# Patient Record
Sex: Female | Born: 1960 | Race: White | Hispanic: No | Marital: Single
Health system: Southern US, Community
[De-identification: ages and names within clinical notes are randomized; demographics above are authoritative.]

---

## 2004-10-08 ENCOUNTER — Inpatient Hospital Stay: Payer: Self-pay | Admitting: Internal Medicine

## 2005-05-29 ENCOUNTER — Ambulatory Visit: Payer: Self-pay | Admitting: Internal Medicine

## 2005-10-02 ENCOUNTER — Ambulatory Visit: Payer: Self-pay | Admitting: Internal Medicine

## 2005-10-18 ENCOUNTER — Ambulatory Visit: Payer: Self-pay | Admitting: Family Medicine

## 2005-11-05 ENCOUNTER — Ambulatory Visit: Payer: Self-pay | Admitting: Family Medicine

## 2005-11-14 ENCOUNTER — Ambulatory Visit: Payer: Self-pay | Admitting: Internal Medicine

## 2005-12-06 ENCOUNTER — Ambulatory Visit: Payer: Self-pay

## 2005-12-10 ENCOUNTER — Ambulatory Visit: Payer: Self-pay | Admitting: Internal Medicine

## 2006-02-07 ENCOUNTER — Ambulatory Visit: Payer: Self-pay

## 2006-04-09 ENCOUNTER — Inpatient Hospital Stay: Payer: Self-pay | Admitting: Obstetrics & Gynecology

## 2006-10-24 ENCOUNTER — Ambulatory Visit: Payer: Self-pay | Admitting: Internal Medicine

## 2008-06-16 ENCOUNTER — Emergency Department: Payer: Self-pay | Admitting: Internal Medicine

## 2009-09-30 ENCOUNTER — Emergency Department: Payer: Self-pay | Admitting: Emergency Medicine

## 2010-03-29 ENCOUNTER — Emergency Department: Payer: Self-pay | Admitting: Internal Medicine

## 2010-10-24 ENCOUNTER — Ambulatory Visit: Payer: Self-pay | Admitting: Internal Medicine

## 2010-11-11 ENCOUNTER — Inpatient Hospital Stay: Payer: Self-pay | Admitting: Internal Medicine

## 2010-11-24 ENCOUNTER — Ambulatory Visit: Payer: Self-pay | Admitting: Internal Medicine

## 2011-04-18 ENCOUNTER — Inpatient Hospital Stay: Payer: Self-pay | Admitting: Internal Medicine

## 2011-04-26 ENCOUNTER — Ambulatory Visit: Payer: Self-pay | Admitting: Internal Medicine

## 2011-05-07 ENCOUNTER — Inpatient Hospital Stay: Payer: Self-pay | Admitting: Student

## 2011-05-07 DIAGNOSIS — I4891 Unspecified atrial fibrillation: Secondary | ICD-10-CM

## 2011-05-26 ENCOUNTER — Ambulatory Visit: Payer: Self-pay | Admitting: Internal Medicine

## 2011-06-15 ENCOUNTER — Inpatient Hospital Stay: Payer: Self-pay | Admitting: Internal Medicine

## 2011-06-23 ENCOUNTER — Inpatient Hospital Stay: Payer: Self-pay | Admitting: Internal Medicine

## 2011-06-26 ENCOUNTER — Ambulatory Visit: Payer: Self-pay | Admitting: Internal Medicine

## 2011-07-03 ENCOUNTER — Inpatient Hospital Stay: Payer: Self-pay | Admitting: Specialist

## 2011-07-03 LAB — URINALYSIS, COMPLETE
Blood: NEGATIVE
Glucose,UR: >=500 mg/dL (ref 0–75)
Leukocyte Esterase: NEGATIVE
Nitrite: NEGATIVE
Ph: 6 (ref 4.5–8.0)
RBC,UR: 4 /HPF (ref 0–5)

## 2011-07-03 LAB — COMPREHENSIVE METABOLIC PANEL
Albumin: 3.2 g/dL — ABNORMAL LOW (ref 3.4–5.0)
Alkaline Phosphatase: 45 U/L — ABNORMAL LOW (ref 50–136)
BUN: 18 mg/dL (ref 7–18)
Bilirubin,Total: 0.6 mg/dL (ref 0.2–1.0)
Chloride: 87 mmol/L — ABNORMAL LOW (ref 98–107)
Co2: 42 mmol/L (ref 21–32)
EGFR (African American): >60
EGFR (Non-African Amer.): >60
SGOT(AST): 62 U/L — ABNORMAL HIGH (ref 15–37)
SGPT (ALT): 28 U/L
Sodium: 136 mmol/L (ref 136–145)
Total Protein: 7.1 g/dL (ref 6.4–8.2)

## 2011-07-03 LAB — CBC
HCT: 46.3 % (ref 35.0–47.0)
HGB: 14.7 g/dL (ref 12.0–16.0)
RBC: 4.7 10*6/uL (ref 3.80–5.20)
RDW: 14.8 % — ABNORMAL HIGH (ref 11.5–14.5)
WBC: 20 10*3/uL — ABNORMAL HIGH (ref 3.6–11.0)

## 2011-07-03 LAB — TROPONIN I: Troponin-I: <0.02 ng/mL

## 2011-07-03 LAB — PRO B NATRIURETIC PEPTIDE: B-Type Natriuretic Peptide: 302 pg/mL — ABNORMAL HIGH (ref 0–125)

## 2011-07-03 LAB — POTASSIUM: Potassium: 3.8 mmol/L (ref 3.5–5.1)

## 2011-07-03 LAB — CK TOTAL AND CKMB (NOT AT ARMC)
CK, Total: 200 U/L (ref 21–215)
CK-MB: 3.2 ng/mL (ref 0.5–3.6)

## 2011-07-04 LAB — BASIC METABOLIC PANEL
Calcium, Total: 9.1 mg/dL (ref 8.5–10.1)
Chloride: 92 mmol/L — ABNORMAL LOW (ref 98–107)
Co2: >45 mmol/L (ref 21–32)
EGFR (African American): >60
EGFR (Non-African Amer.): >60
Glucose: 109 mg/dL — ABNORMAL HIGH (ref 65–99)
Sodium: 145 mmol/L (ref 136–145)

## 2011-07-04 LAB — CBC WITH DIFFERENTIAL/PLATELET
Basophil #: 0 10*3/uL (ref 0.0–0.1)
Eosinophil #: 0 10*3/uL (ref 0.0–0.7)
Lymphocyte #: 0.4 10*3/uL — ABNORMAL LOW (ref 1.0–3.6)
Lymphocyte %: 4.7 %
MCHC: 31.5 g/dL — ABNORMAL LOW (ref 32.0–36.0)
MCV: 97 fL (ref 80–100)
Monocyte %: 3.2 %
Platelet: 207 10*3/uL (ref 150–440)
RDW: 14.3 % (ref 11.5–14.5)
WBC: 8.3 10*3/uL (ref 3.6–11.0)

## 2011-07-04 LAB — HEPATIC FUNCTION PANEL A (ARMC)
Albumin: 2.9 g/dL — ABNORMAL LOW (ref 3.4–5.0)
Bilirubin, Direct: <0.1 mg/dL (ref 0.00–0.20)
Bilirubin,Total: 0.4 mg/dL (ref 0.2–1.0)
SGOT(AST): 13 U/L — ABNORMAL LOW (ref 15–37)
SGPT (ALT): 20 U/L

## 2011-07-04 LAB — TROPONIN I: Troponin-I: <0.02 ng/mL

## 2011-07-04 LAB — CK-MB: CK-MB: 2.9 ng/mL (ref 0.5–3.6)

## 2011-07-05 LAB — BASIC METABOLIC PANEL
Anion Gap: 8 (ref 7–16)
BUN: 17 mg/dL (ref 7–18)
Calcium, Total: 9.4 mg/dL (ref 8.5–10.1)
Chloride: 91 mmol/L — ABNORMAL LOW (ref 98–107)
Creatinine: 0.37 mg/dL — ABNORMAL LOW (ref 0.60–1.30)
Potassium: 3.1 mmol/L — ABNORMAL LOW (ref 3.5–5.1)
Sodium: 141 mmol/L (ref 136–145)

## 2011-07-05 LAB — VANCOMYCIN, TROUGH: Vancomycin, Trough: 12 ug/mL (ref 10–20)

## 2011-07-08 LAB — CULTURE, BLOOD (SINGLE)

## 2011-07-09 LAB — CULTURE, BLOOD (SINGLE)

## 2011-07-27 ENCOUNTER — Ambulatory Visit: Payer: Self-pay | Admitting: Internal Medicine

## 2011-07-27 DEATH — deceased

## 2011-08-01 LAB — EXPECTORATED SPUTUM ASSESSMENT W GRAM STAIN, RFLX TO RESP C

## 2012-07-03 IMAGING — CR DG CHEST 1V PORT
1 series · 1 of 1 positions shown · non-contrast
Comparison: none

REASON FOR EXAM: shortness of breath
COMMENTS:

[view not recorded]
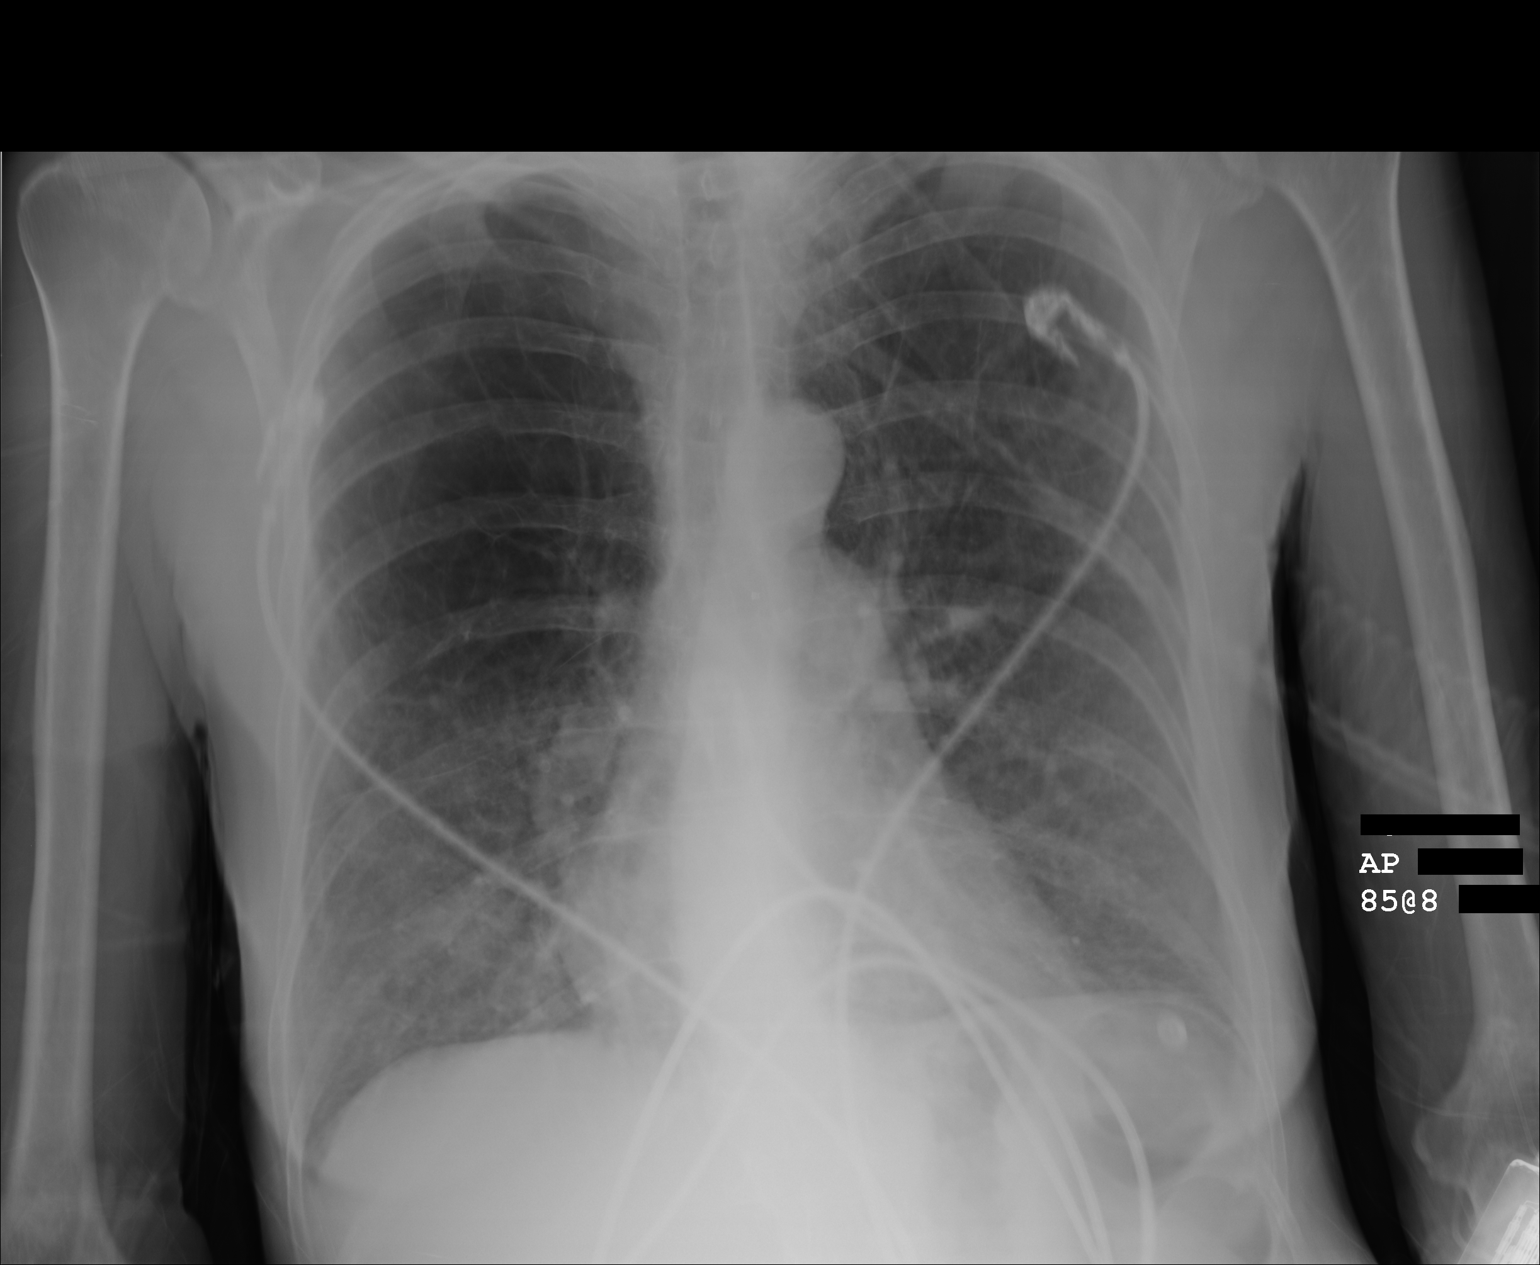

[1 of 1 positions shown; findings below may reference images not displayed]

PROCEDURE:     DXR - DXR PORTABLE CHEST SINGLE VIEW  - May 07, 2011 [DATE]

RESULT:     Comparison is made to the prior exam of 04/18/2011. There is
thickening of the lung markings bilaterally compatible with pulmonary
fibrosis. No pneumonia is identified. No pleural effusion or pulmonary edema
is seen. Heart size is normal. Monitoring electrodes are present.
IMPRESSION: 1. No definite acute changes are identified.
2. Stable appearing chest as compared to the exam of 04/18/2011.
[DATE]. The chest appears bilaterally hyperexpanded compatible with a history of
COPD or asthma.

## 2012-08-29 IMAGING — CR DG CHEST 1V PORT
1 series · 1 of 1 positions shown · non-contrast
Comparison: none

REASON FOR EXAM: Shortness of Breath
COMMENTS:

[portable]
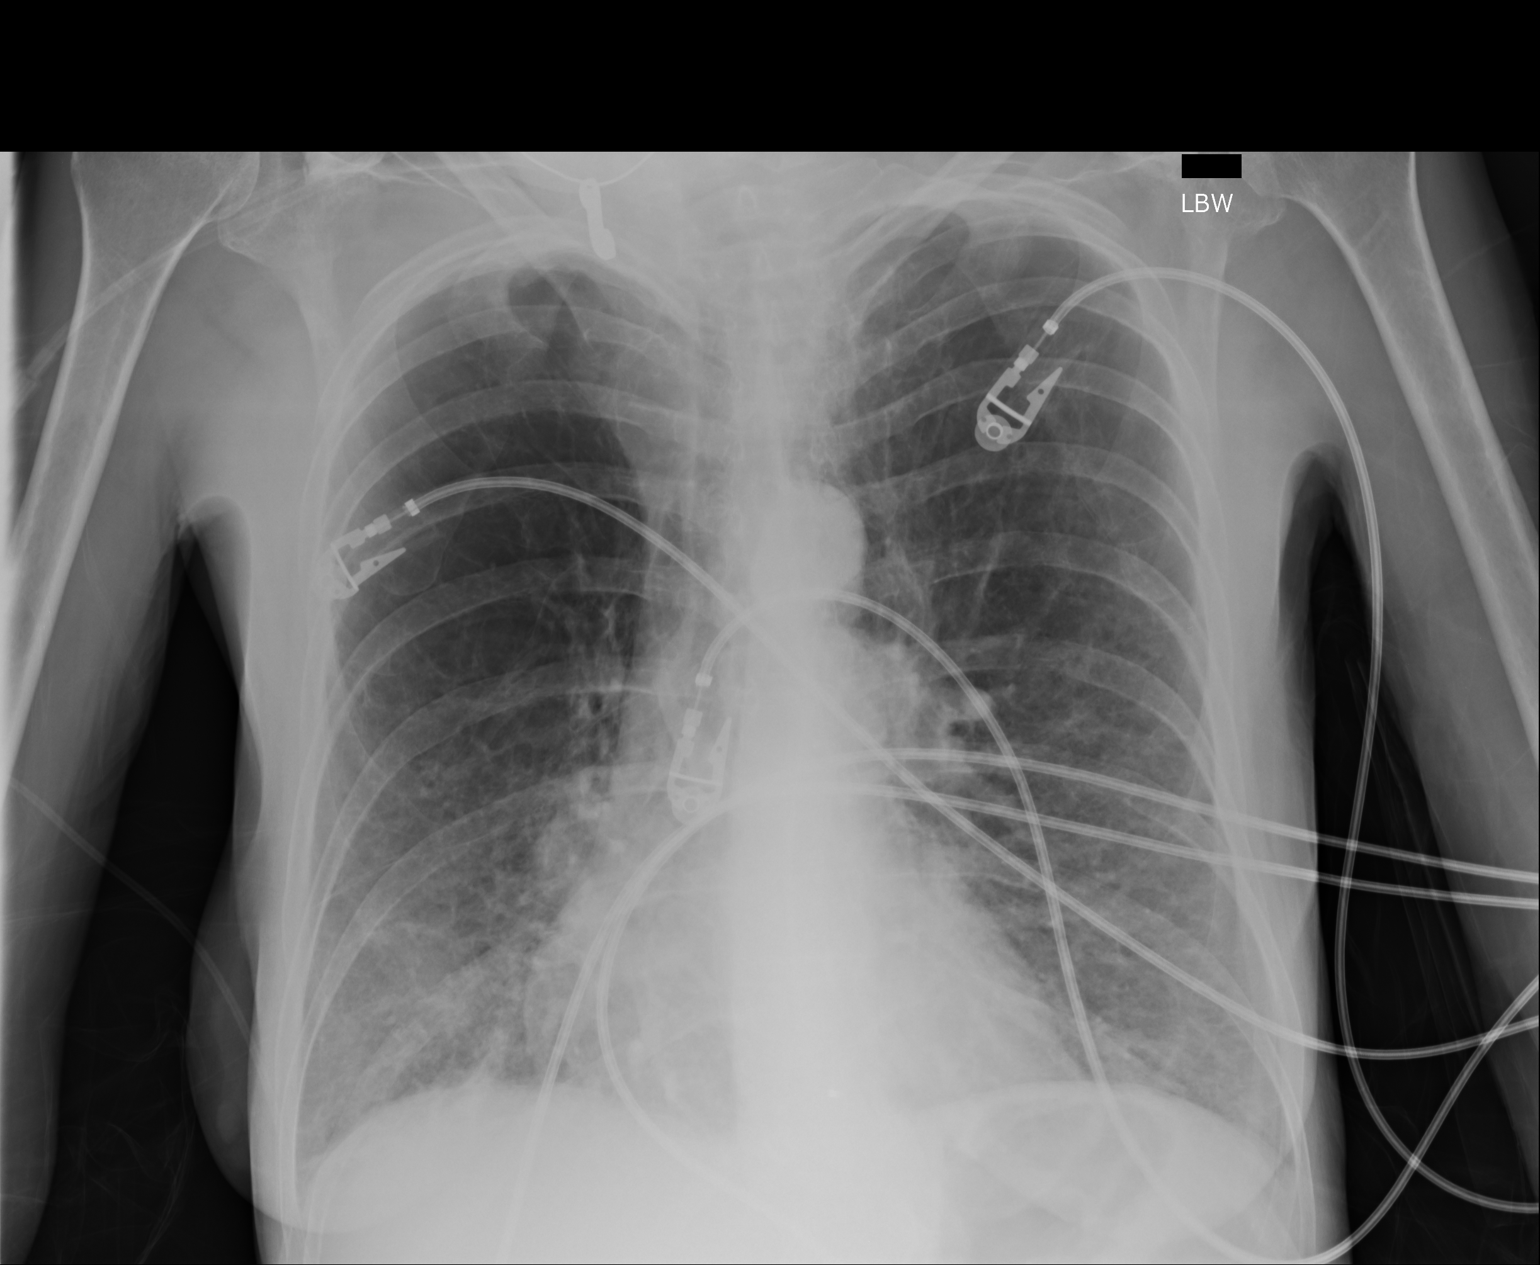

[1 of 1 positions shown; findings below may reference images not displayed]

PROCEDURE:     DXR - DXR PORTABLE CHEST SINGLE VIEW  - July 03, 2011  [DATE]

RESULT:     Comparison is made to the prior exam of 06/23/2011. There is
thickening of the interstitial markings in lung bases bilaterally. These
changes are less prominent than that observed on the exam of 06/22/2001. The
appearance is consistent with bibasilar interstitial pneumonia or with
bibasilar interstitial edema superimposed on COPD. Heart size is normal. No
new infiltrates are seen.
IMPRESSION: 1. There is thickening of the interstitial lung markings, less prominent
than that observed on the exam of 06/23/2011 and consistent with
interstitial edema or interstitial pneumonia.
2. COPD.

## 2014-10-17 NOTE — Discharge Summary (Signed)
PATIENT NAME:  Jamie Nielsen, Jamie Nielsen MR#:  [id number] DATE OF BIRTH:  October 07, 1960  DATE OF ADMISSION:  07/03/2011 DATE OF DISCHARGE:  07/05/2011  For a detailed note, please take a look at the history and physical done on admission by Dr. Lafayette DragonFirozvi.   DIAGNOSES AT DISCHARGE:  1. Acute hypoxic hypercarbic respiratory failure secondary to chronic obstructive pulmonary disease.   2. Chronic obstructive pulmonary disease exacerbation secondary to ongoing tobacco abuse.  3. Chronic pain syndrome.   4. Hypertension.   5. Gastroesophageal reflux disease.   6. Chronic constipation due to high-dose narcotics.   DIET: The patient was discharged on a low-sodium diet. She was discharged and home health nursing and physical therapy services.   FOLLOWUP: With the patient'Nielsen primary care physician in [place], Dr Jamie NasutiMena.     DISCHARGE MEDICATIONS:  1. Nicotine patch.  2. Symbicort 2 puffs b.i.d.  3. Atrovent 2 puffs as needed.   4. Diltiazem 120 mg daily.  5. Albuterol 1 to 2 puffs as needed.   6. Amitiza 25 mcg 1 cap b.i.d.  7. Aspirin 81 mg daily.  8. Evista 60 mg daily.  9. Reglan 10 mg b.i.d.  10. Omeprazole 20 mg daily.  11. Tessalon Perles 1 cap t.i.d. as needed for cough.  12. Topamax 50 mg b.i.d.  13. Zyflo 600 mg 2 tabs q.i.d.  14. Zyrtec 10 mg daily as needed.  15. Combivent 2 puffs 4 times daily.   16. Oxygen 4L nasal cannula continuously.  17. Roxanol 5 mL which is 20 mg p.o. q.3-4h. p.r.n. shortness of breath.  18. Prednisone taper as directed which is 60 mg daily x2 days, all the way down to 10 mg over the next 12 days.  19. Levaquin 750 mg daily x7 days.   CONSULTANTS DURING THE HOSPITAL COURSE:  Dr Freda MunroSaadat Khan from Pulmonary, Dr. Harriett SineNancy Phifer from Palliative Care.   PERTINENT STUDIES DONE DURING THE HOSPITAL COURSE:  Chest x-ray done on admission showing thickening of the interstitial lung markings, less prominent than that observed on 06/23/2011, consistent with interstitial edema or  interstitial pneumonia with underlying chronic obstructive pulmonary disease. The patient also had blood cultures which are negative.    HOSPITAL COURSE: This is a 54 year old female with medical problems as mentioned above who presented to the hospital secondary to shortness of breath and acute respiratory failure.  1. Acute on chronic hypoxic hypercarbic respiratory failure. When patient initially presented to the hospital she was acidotic with an elevated pCO2 greater than 128. She was initially started on BiPAP, also started on aggressive IV steroids, around-the-clock nebulizer treatments, and also empiric antibiotics with vancomycin and Zosyn. Over the next few days the patient'Nielsen shortness of breath and clinical symptoms have improved. She is back down to her 4L of oxygen. Her pCO2 has come down to 70. Her mental status has improved. She was not able to give us a sputum sample. Her blood cultures have remained negative. She is afebrile. She is therefore being discharged on a p.o. prednisone taper along with her maintenance inhalers and empiric antibiotics for the next few days. The patient did require high doses of IV narcotics during the hospital course. She was advised to take some Roxanol p.o. which is what she has used in the past for her breathlessness and chronic pain.  2. Chronic obstructive pulmonary disease exacerbation. As mentioned this patient was initially treated with around-the-clock nebulizer treatments, increased IV steroids, and empiric antibiotics. Clinically she has significantly improved over the next  few days. She is now back down to her baseline O2, will be discharged on a prednisone taper and empiric antibiotics and maintenance inhalers with close followup with her pulmonologist at [facility].  3. Chronic pain syndrome. The patient does take high doses of narcotics at home. She has been on as high as 160 mg of OxyContin in the past along with some p.r.n. oxycodone. She did receive some IV  morphine, and she was requiring it almost every 2 to 4 hours. I attempted to taper this.  The patient was not very pleased about this. I think she would be better off using some Roxanol which she has used in the past for her pain. That will also help her with her breathlessness. She needs to follow up with her regular doctor, Dr. Michelle Nielsen, which provides her with chronic pain medications as I did not give her any prescriptions at this time.  4. Hypertension. The patient was hemodynamically stable on her Cardizem. She will resume.  5. Gastroesophageal reflux disease. The patient was maintained on her omeprazole. She will resume that.  6. Chronic constipation due to increasing doses of narcotics. The patient is already on Amitiza which she will resume that.   CODE STATUS: Patient is a do not intubate, do not resuscitate.   TIME SPENT WITH THE DISCHARGE: 40 minutes.     ____________________________ Rolly Pancake. Cherlynn Kaiser, MD vjs:vtd D: 07/05/2011 17:11:07 ET T: 07/07/2011 12:11:32 ET JOB#: [id number]  cc: Rolly Pancake. Cherlynn Kaiser, MD, <Dictator> Dr Jamie Nielsen in [place] Siren MD ELECTRONICALLY SIGNED 07/09/2011 16:42

## 2014-10-17 NOTE — Discharge Summary (Signed)
PATIENT NAME:  Jamie ParkinsonMAJORS, [name] MR#:  [id number] DATE OF BIRTH:  Feb 21, 1961  DATE OF ADMISSION:  06/23/2011 DATE OF DISCHARGE:  06/27/2011  ADDENDUM   MEDICATIONS:  The patient's medication list has been changed and it is as follows: 1. Nicotine patch, unknown dose, once daily, topically. 2. Symbicort, unknown dose, inhalation twice daily.  3. Atrovent 2 puffs every two hours as needed.  4. Albuterol HFA one to two puffs as needed.  5. Amitiza 25 mcg capsule 1 capsule twice daily.  6. Aspirin 81 milligrams p.o. daily.  7. Evista 60 mg p.o. daily.  8. Metoclopramide 10 mg p.o. twice daily.  9. Omeprazole 20 mg p.o. daily.  10. Tessalon 200 mg 3 times daily as needed.  11. Topamax 50 milligrams p.o. twice daily.  12. Zyflo CR 300 mg p.o. 2 tablets four times daily. 13. Zyrtec 10 mg p.o. at bedtime.  14. Combivent 2 puffs 4 times daily.  15. Oxygen 5 liters of oxygen through nasal cannula.  16. Morphine 5 mL every two hours as needed for shortness of breath.   ADDITIONAL MEDICATIONS:  1. Norco 5/325 milligrams 1 to 2 tablets every four hours as needed  2. Diltiazem CD 120 milligrams p.o. daily.  3. Prednisone 60 mg p.o. once on 06/28/2011, then taper x 10 mg every two days until done.  4. Zyvox 600 milligrams p.o. twice daily for 10 days.  5. Ciprofloxacin 500 milligrams p.o. twice daily for 10 days.  ____________________________ Katharina Caperima Takenya Travaglini, MD rv:ap D: 06/27/2011 19:22:55 ET             T: 06/29/2011 12:19:55 ET                  JOB#: [id number] cc: Katharina Caperima Jenny Omdahl, MD, <Dictator> [facility]  Johnhenry Tippin MD ELECTRONICALLY SIGNED 07/23/2011 7:16

## 2014-10-17 NOTE — Discharge Summary (Signed)
PATIENT NAME:  Jamie Nielsen, KROENING MR#:  [id number] DATE OF BIRTH:  08/06/1960  DATE OF ADMISSION:  06/23/2011 DATE OF DISCHARGE:  06/27/2011  ADMITTING DIAGNOSIS: Acute on chronic respiratory failure.  DISCHARGE DIAGNOSES: 1. Acute on chronic respiratory failure due to chronic obstructive pulmonary disease exacerbation.  2. Bacterial pneumonia, unclear etiology, suspected antibiotic resistant bacteria.  3. Atrial fibrillation, resolved.  4. Leukocytosis. 5. Metabolic encephalopathy.   DISCHARGE CONDITION: Fair.  DISCHARGE MEDICATIONS: The patient is to resume her outpatient medications which are: 1. Nicotine patch, unknown dose, once daily.  2. Symbicort, unknown strength, one puff twice daily.  3. Atrovent 2 puffs every two hours as needed.  4. Albuterol HFA 1 to 2 puffs as needed.  5. Amitiza 24 mcg twice daily.  6. Aspirin 81 mg p.o. daily.  7. Evista 60 mg p.o. daily.  8. Metoclopramide 10 mg p.o. twice daily.  9. Omeprazole 20 mg p.o. daily.  10. Tessalon 200 mg 3 times daily as needed.  11. Topamax 50 mg p.o. twice daily.  12. Zyflo CR 600 mg 2 tablets four times daily.  13. Zyrtec 10 mg p.o. at bedtime.  14. Combivent 2 puffs 4 times daily.  15. Oxygen at 4 liters through nasal cannula.  16. Morphine 5 mL every two hours as needed for shortness of breath.   ADDITIONAL MEDICATIONS:  1. Norco 5/325 mg once every four hours as needed.  2. Diltiazem CD 120 mg p.o. daily.  3. Prednisone 60 mg p.o. once on 06/28/2011, then taper by 10 mg every two days until done.   HOME OXYGEN: Portable tank at 5 liters of oxygen through nasal cannula.   DIET: Regular.   PHYSICAL ACTIVITY LIMITATIONS: As tolerated.   FOLLOW-UP: Follow-up appointment with her primary care physician at [facility] in two days after discharge.    The patient was initially thought to be discharged with Hospice, however, she refused Hospice care. She requested home health physical therapy as well as RN  which will be established for her upon discharge.   REASON FOR ADMISSION: The patient is a 54 year old Caucasian female with past medical history significant for history of end-stage COPD, oxygen-dependent, and history of ongoing tobacco abuse who presented to the hospital feeling weak after she was discharged from the hospital for the same COPD exacerbation with the same symptoms with shortness of breath, cough, and wheezing. She was using nebulizers every few hours but with no significant relief. She denied, however, any fevers or chills.  PHYSICAL EXAMINATION: On arrival to the Emergency Room, temperature 97.5, heart rate 117, respiration rate 30, blood pressure 126/88, saturation 87% on oxygen therapy. On physical exam she had coarse wheezing sounds bilaterally and was struggling to breathe.   LABORATORY, DIAGNOSTIC, AND RADIOLOGICAL DATA: Labs in the Emergency Room showed glucose of 132, otherwise unremarkable BMP. The patient's B-type Natriuretic Peptide was 120. The patient's bicarbonate level was elevated at 41. The patient's liver enzymes were remarkable for albumin level of 3.2. Cardiac enzymes, first set, was unremarkable. The patient's CBC showed white blood cell count elevated to 14.3, hemoglobin 15.2, platelets 213. Blood cultures x2 taken 06/23/2011 showed no growth. Influenzas were also negative. ABGs done on 48% FiO2 showed pH of 7.28, pCO2 100, pO2 83, saturation 94.7%. EKG showed sinus tachy at 117 beats per minute, biatrial enlargement. Nonspecific ST-T changes were noted.   The patient's radiologic studies on arrival to the hospital 06/23/2011 portable single view x-ray showed bilateral lower lobe airspace disease concerning  for multilobar pneumonia. Recommend follow-up radiography to document complete resolution following adequate medical therapy.   HOSPITAL COURSE: The patient was admitted to the hospital. She was started on broad-spectrum antibiotics to cover antibiotic-resistant  bacteria such as Methicillin-resistant Staphylococcus aureus or Pseudomonas. With this therapy, she improved significantly. Over the next 5 or so days while she was receiving antibiotics her condition improved, her wheezing disappeared, as well as her oxygen saturation improved. Initially the patient refused BiPAP, however, later on she was agreeable for that. BiPAP helped her significantly. However, she was weaned off BiPAP and she was placed back on nasal cannula at 5 liters of oxygen through nasal cannula. Initially she would desaturate whenever she would move, even small movements would keep her oxygen saturations as low as 60's, however, later on she was noted to have significantly better oxygen endurance. On day of discharge, however, the patient still remained tachycardic with heart rate ranging from 112 to 115, temperature 98, respiration rate of 22, blood pressure 149/85, and saturation was 89% on 5 liters of oxygen through nasal cannula at rest fluctuating between 90 to 93 on 5 liters of oxygen through nasal cannula. She seemed to be more comfortable and requested to be discharged home. Initially it was thought that the patient would be discharged home with Hospice, however, she refused Hospice care. She was discharged home on antibiotics to cover her for antibiotic resistant bacteria as mentioned above for possible MRSA as well as Pseudomonas. The patient will use Zyvox 600 mg p.o. twice daily as well as Cipro 500 mg p.o. twice daily dose for 10 days.  ____________________________ Katharina Caperima Oluwadamilare Tobler, MD rv:drc D: 06/27/2011 19:15:33 ET T: 06/29/2011 11:41:21 ET JOB#: [id number]  cc: Katharina Caperima Melika Reder, MD, <Dictator> Rudie Rikard MD ELECTRONICALLY SIGNED 07/23/2011 7:16

## 2014-10-17 NOTE — Discharge Summary (Signed)
PATIENT NAME:  Jamie Nielsen, Jamie Nielsen MR#:  [id number] DATE OF BIRTH:  1960-11-03  DATE OF ADMISSION:  06/23/2011 DATE OF DISCHARGE:  06/27/2011  ADDENDUM:  ASSESSMENT AND PLAN: The patient will be discharged on antibiotic therapy for 10 days to complete course. She is also going to continue steroid taper at home as well as oxygen and inhalation therapy as previously prescribed. She is to use morphine as needed every two hours for shortness of breath. She is to follow-up with [facility] or [facility] for management of her chronic medical problems, chronic respiratory failure. The patient, however, would benefit from Hospice at home despite her refusal to continue Hospice during this discharge.   In regards to other medical problems such as atrial fibrillation, it appeared that the patient had one episode of atrial fibrillation while she was in the hospital on admission, however, atrial fibrillation resolved and the patient was in sinus rhythm all her stay in the hospital time with no interventions. It was felt that the patient'Nielsen atrial fibrillation was related just to shortness of breath and severe respiratory failure.   The patient was somewhat somnolent and confused. She was hypercarbic, however, this metabolic encephalopathy resolved after she used BiPAP and did not reappear when she was on high doses of oxygen. It was felt that the patient'Nielsen metabolic encephalopathy was related to hypercarbia CO2 retention.   The patient had leukocytosis on arrival to the hospital. It was likely related to her pneumonia. White blood cell count is high at 15.3 on day of admission, 06/23/2011. The patient'Nielsen white blood cell count normalized by 06/24/2011 and was within normal limits   CONDITION ON DISCHARGE: The patient is being discharged in stable condition, however, very grave condition. Her condition is terminal. She is at high risk of rehospitalization and failure.       TIME SPENT: 40 minutes taking care of the patient, dictating discharge instructions, as well as dictating discharge summary.   ____________________________ Katharina Caperima Ahmani Prehn, MD rv:drc D: 06/27/2011 19:20:12 ET T: 06/29/2011 12:02:53 ET JOB#: [id number]  cc: Katharina Caperima Tennille Montelongo, MD, <Dictator> Jandy Brackens MD ELECTRONICALLY SIGNED 07/23/2011 7:15

## 2014-10-17 NOTE — H&P (Signed)
PATIENT NAME:  Jamie Nielsen, Jamie Nielsen MR#:  [id number] DATE OF BIRTH:  11-05-1960  DATE OF ADMISSION:  07/03/2011  REFERRING PHYSICIAN: Gaetano Net, DO    PRIMARY CARE PHYSICIAN: Dr. Michelle Nasuti in [place], [place]   PRIMARY PULMONOLOGIST: [facility]   REASON FOR ADMISSION: Respiratory failure, shortness of breath, persistent smoking.   HISTORY OF PRESENT ILLNESS: The patient is a 54 year old white female with a past medical history of shortness of breath, productive cough, wheezing, who was discharged on 06/27/2011. She re-presents with shortness of breath, wheezing, cough after continuing to use cigarettes. The patient is on 4 liters of oxygen.  She has been having over the last couple of days worsening shortness breath, cough, black sputum. No fevers, no chills. She says she has chest pain with deep breathing. She said she is still requiring 4 liters of oxygen. EMS came and found the trailer that she is living in with a significant amount of smoke from smoking and family smoking. She was found to have ABG consistent with respiratory failure which rose to pH 7.27, pCO2 120, pO2 69. She was put on BiPAP, azithromycin, cefepime, vancomycin and Solu-Medrol. She has had some improvement. She states that she is DO NOT RESUSCITATE . Palliative Care was consulted by the Emergency Room. The patient denies fevers, chills, or shakes. No diarrhea. She has lower extremity edema which she says is chronic. She said she has been ambulating. We are asked to admit the patient for respiratory failure.   PAST MEDICAL HISTORY:  1. Chronic obstructive pulmonary disease, oxygen dependent.  2. Hypercapnic hypoxic respiratory failure.  3. Hypertension.  4. Hypomagnesemia.  5. Dysuria. 6. Diabetes.  7. Tobacco use.   ALLERGIES: Avelox, doxycycline, sulfa.  HOME MEDICATIONS:  1. Nicotine patch. 2. Symbicort. 3. Atrovent nebulizers. 4. Albuterol HFA. 5. Amitiza 24 mcg b.i.d.  6. Aspirin 81 mg daily. 7. Evista 60 mcg  daily.  8. Reglan 10 mg b.i.d.   9. Omeprazole 20 mg daily.  10. Tessalon Perles 200 t.i.d.  11. Topamax 50 mg b.i.d.   12. Zyflo CR 2 tablets q.i.d.  13. Zyrtec 10 mg daily.  14. Combivent 2 puffs q.i.d.  15. Oxygen: 4 liters.  16. Morphine 5 mL p.r.n. shortness of breath.  17. Diltiazem 120 mg daily.  18. Norco 5/325, 1 tablet p.o. every 4 hours.  19. Prednisone taper.   NOTE: The patient apparently states that she was on antibiotics, completed that.   SOCIAL HISTORY: She continues to smoke. She smoked 1-1/2 packs a day and is down to 1/2 pack now. She does not drink or use illicit drugs. She is disabled. She  has four healthy sons.   FAMILY HISTORY: Mother is living. Father died in his 78s of chronic obstructive pulmonary disease.   REVIEW OF SYSTEMS: CONSTITUTIONAL: No fever but she has fatigue and weakness. EYES: No blurred vision, double vision, pain, redness, inflammation, glaucoma. ENT: No tinnitus, ear pain, hearing loss, seasonal allergies, epistaxis, or discharge. RESPIRATORY: Cough, dyspnea, painful respirations, chronic obstructive pulmonary disease. CARDIOVASCULAR: Chest pain but no orthopnea, edema, arrhythmia, dyspnea on exertion, palpitations. GI: No nausea, vomiting, diarrhea, abdominal pain. No hematemesis, melena, or GERD. GU: No dysuria, hematuria, renal calculi, increased frequency, or incontinence. GYN/BREASTS: No breast masses or tenderness, vaginal discharge. ENDOCRINE: No polyuria, nocturia, thyroid problems, increased sweating, heat or cold intolerance. HEME/LYMPH: No anemia, easy bruising, or swollen glands. INTEGUMENT: No acne, rash, lesions, change in mole, hair or skin. MUSCULOSKELETAL: No pain in back, shoulder, knee,  hip, arthritis, swelling, gout. NEUROLOGICAL: No numbness, weakness, dysarthria, epilepsy, tremor, vertigo, ataxia. PSYCHIATRIC: No anxiety, insomnia, ADD, bipolar, or depression.   PHYSICAL EXAMINATION:  VITAL SIGNS: Temperature 96.8, heart rate  102, blood pressure 110/81, respiratory rate 26 on BiPAP. The patient is cachectic, looks older than her stated age.   HEENT: Pupils are equal and reactive to light and accommodation. Anicteric sclerae. No difficulty hearing. Oropharynx is clear.   NECK: No JVD. No thyromegaly, no lymphadenopathy. No carotid bruits.   LUNGS: She has use of accessory muscles, respiratory distress, coarse breath sounds with wheezing throughout.   CARDIOVASCULAR: Tachycardic. No murmurs, gallops, or rubs appreciated. No lower extremity edema. 2+ dorsalis pedis pulses. Trace lower extremity edema.   BREASTS: No obvious mass.   ABDOMEN: Soft, nontender, nondistended. Positive bowel sounds. No organomegaly.   GU: Deferred.   MUSCULOSKELETAL: Strength 5 out of 5. She has clubbing, grade 3. No degenerative joint disease. She has some kyphosis.   SKIN: No rashes, lesions, or indurations. She does have nicotine stains on her hands.   LYMPH: No lymphadenopathy in the cervical or supraclavicular area.   NEUROLOGICAL:   Cranial nerves II through XII are intact. Strength 5 out of 5. Follows commands.   PSYCHIATRIC: Alert and oriented x3.   LABORATORY, DIAGNOSTIC AND RADIOLOGICAL DATA:  Sinus tachycardia. No ST changes.  Chest x-ray shows possible interstitial pneumonia.  Glucose elevated at 331, BUN 18, creatinine 0.22, sodium 136, potassium 6.3, chloride 87, bicarbonate 42, anion gap 7, total protein 7.1, albumin 2.2, total bilirubin 0.6, alkaline phosphatase 45, AST 62.  CK 200, MB 3.2, troponin less than 0.02.  White count 20,  hemoglobin 14.7, hematocrit 46.3, platelets 223.   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    1. Acute on chronic hypoxic hypercapnic respiratory failure secondary to chronic obstructive pulmonary disease exacerbation: The patient has possible interstitial infiltrate. We will give IV vancomycin, Zosyn, nebulizers. We will start theophylline, get Pulmonary consult, Solu-Medrol, Symbicort. We will get Palliative Care consult.  2. Relative hypotension: We will hold hypertensive medications but continue diltiazem at low dose.  3. Tachycardia with history of flutter: Again, we will cycle cardiac enzymes and put the patient on p.r.n. morphine and nitroglycerin as the patient has some chest pain which is most likely pleuritic in nature.  4. Hyperkalemia: We will monitor on telemetry and give Kayexalate. Recheck potassium later today. The patient has no tenting of T waves.   5. Diabetes: We will continue sliding scale insulin. A1c in 04/2011 was 7.3.  6. Tobacco abuse: The patient was counseled about cessation and put on nicotine patch. She was counseled for over 3 minutes.  7. Low albumin: We will screen for malnutrition.  8. Elevated AST: We will repeat LFTs tomorrow.  9. Leukocytosis: This is most likely due to steroids and infection.  10. Deep vein thrombosis prophylaxis: Maintain with Lovenox and aspirin.   CODE STATUS:  DNR   TOTAL TIME SPENT ON ADMISSION: 60 minutes of critical care time.       Thank you for allowing me to participate in the care of this patient  ____________________________ Corie ChiquitoAmir A. Lafayette DragonFirozvi, MD aaf:cbb D: 07/03/2011 17:38:55 ET T: 07/03/2011 18:45:22 ET JOB#: [id number]  cc: Karolee OhsAmir A. Lafayette DragonFirozvi, MD, <Dictator> Dr. [place]  [facility] Pulmonology Department Karolee Ohs Laverda Page MD ELECTRONICALLY SIGNED 07/04/2011 16:38
